# Patient Record
Sex: Male | Born: 1995 | Race: White | Hispanic: No | Marital: Single | Smoking: Never smoker
Health system: Southern US, Community
[De-identification: ages and names within clinical notes are randomized; demographics above are authoritative.]

## PROBLEM LIST (undated history)

## (undated) DIAGNOSIS — Z91018 Allergy to other foods: Secondary | ICD-10-CM

---
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
## 2017-03-24 ENCOUNTER — Other Ambulatory Visit (HOSPITAL_COMMUNITY): Payer: Self-pay | Admitting: Pulmonary Disease

## 2017-03-24 ENCOUNTER — Ambulatory Visit (HOSPITAL_COMMUNITY)
Admission: RE | Admit: 2017-03-24 | Discharge: 2017-03-24 | Disposition: A | Discharge status: Home / Self Care | Payer: 59 | Source: Ambulatory Visit | Attending: Pulmonary Disease | Admitting: Pulmonary Disease

## 2017-03-24 DIAGNOSIS — M25512 Pain in left shoulder: Secondary | ICD-10-CM

## 2017-03-24 DIAGNOSIS — R091 Pleurisy: Secondary | ICD-10-CM | POA: Diagnosis not present

## 2017-03-24 DIAGNOSIS — J45909 Unspecified asthma, uncomplicated: Secondary | ICD-10-CM | POA: Diagnosis not present

## 2017-03-24 DIAGNOSIS — R079 Chest pain, unspecified: Secondary | ICD-10-CM | POA: Diagnosis not present

## 2017-03-24 DIAGNOSIS — L700 Acne vulgaris: Secondary | ICD-10-CM | POA: Diagnosis not present

## 2018-03-23 DIAGNOSIS — J301 Allergic rhinitis due to pollen: Secondary | ICD-10-CM | POA: Diagnosis not present

## 2018-03-23 DIAGNOSIS — J45909 Unspecified asthma, uncomplicated: Secondary | ICD-10-CM | POA: Diagnosis not present

## 2018-06-02 DIAGNOSIS — H5203 Hypermetropia, bilateral: Secondary | ICD-10-CM | POA: Diagnosis not present

## 2018-10-20 IMAGING — DX DG SHOULDER 2+V*L*
3 series · 3 of 3 positions shown · non-contrast
Comparison: None.

CLINICAL DATA: 21-year-old presenting with left shoulder pain and
left-sided chest pain over the past 3 weeks which has worsened over
the past 4 days. No known injuries.

EXAM:
LEFT SHOULDER - 2+ VIEW

[shoulder grashey]
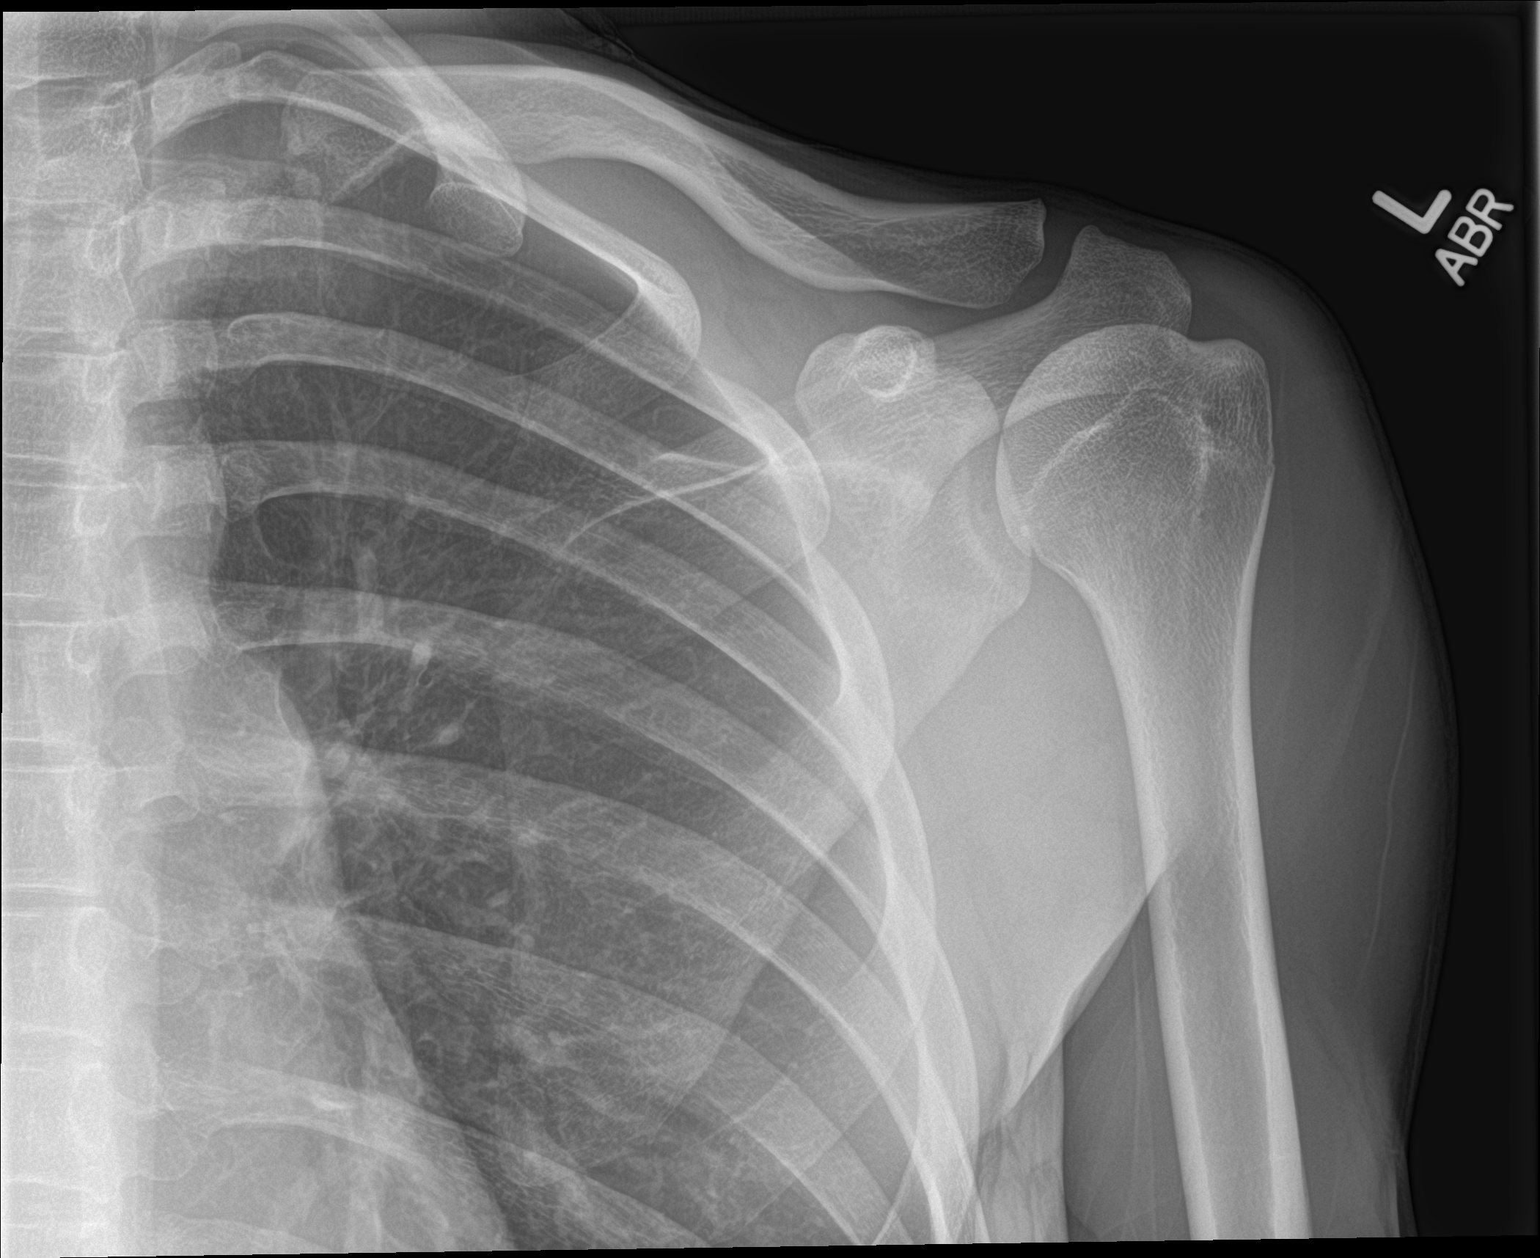

[shoulder y view]
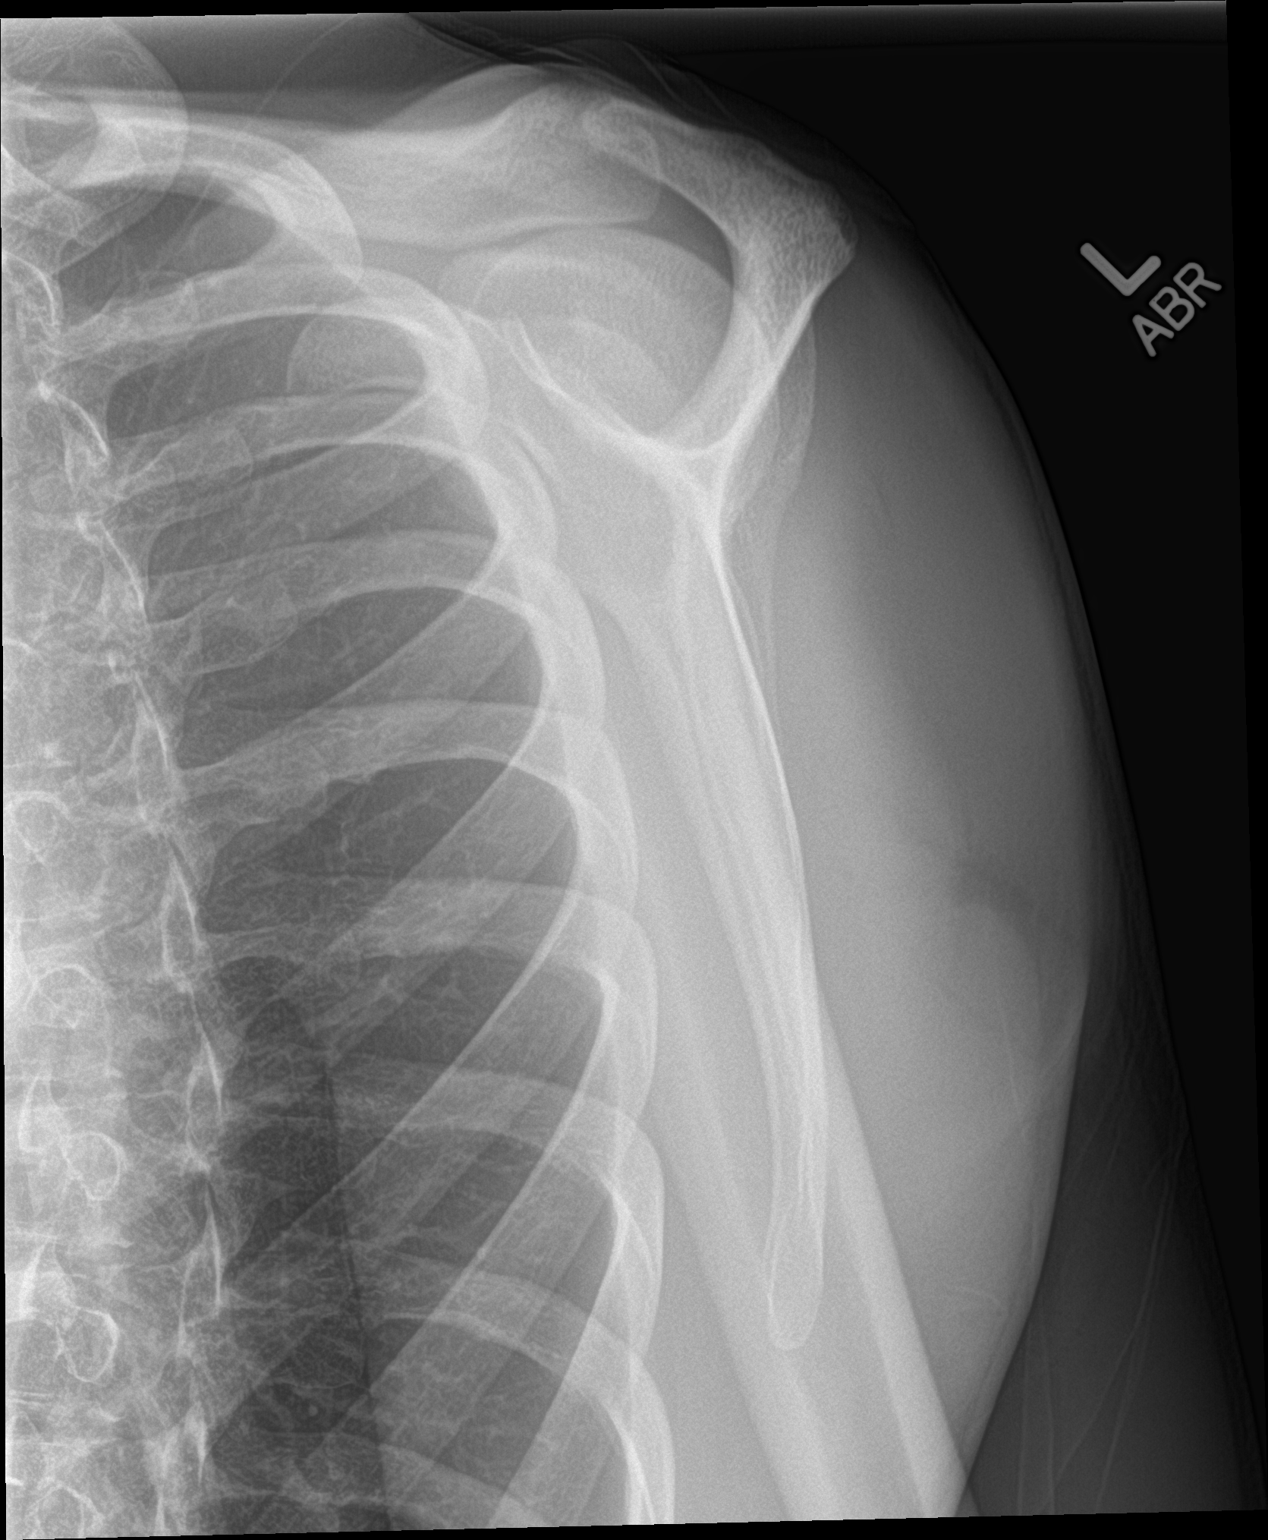

[shoulder axillary]
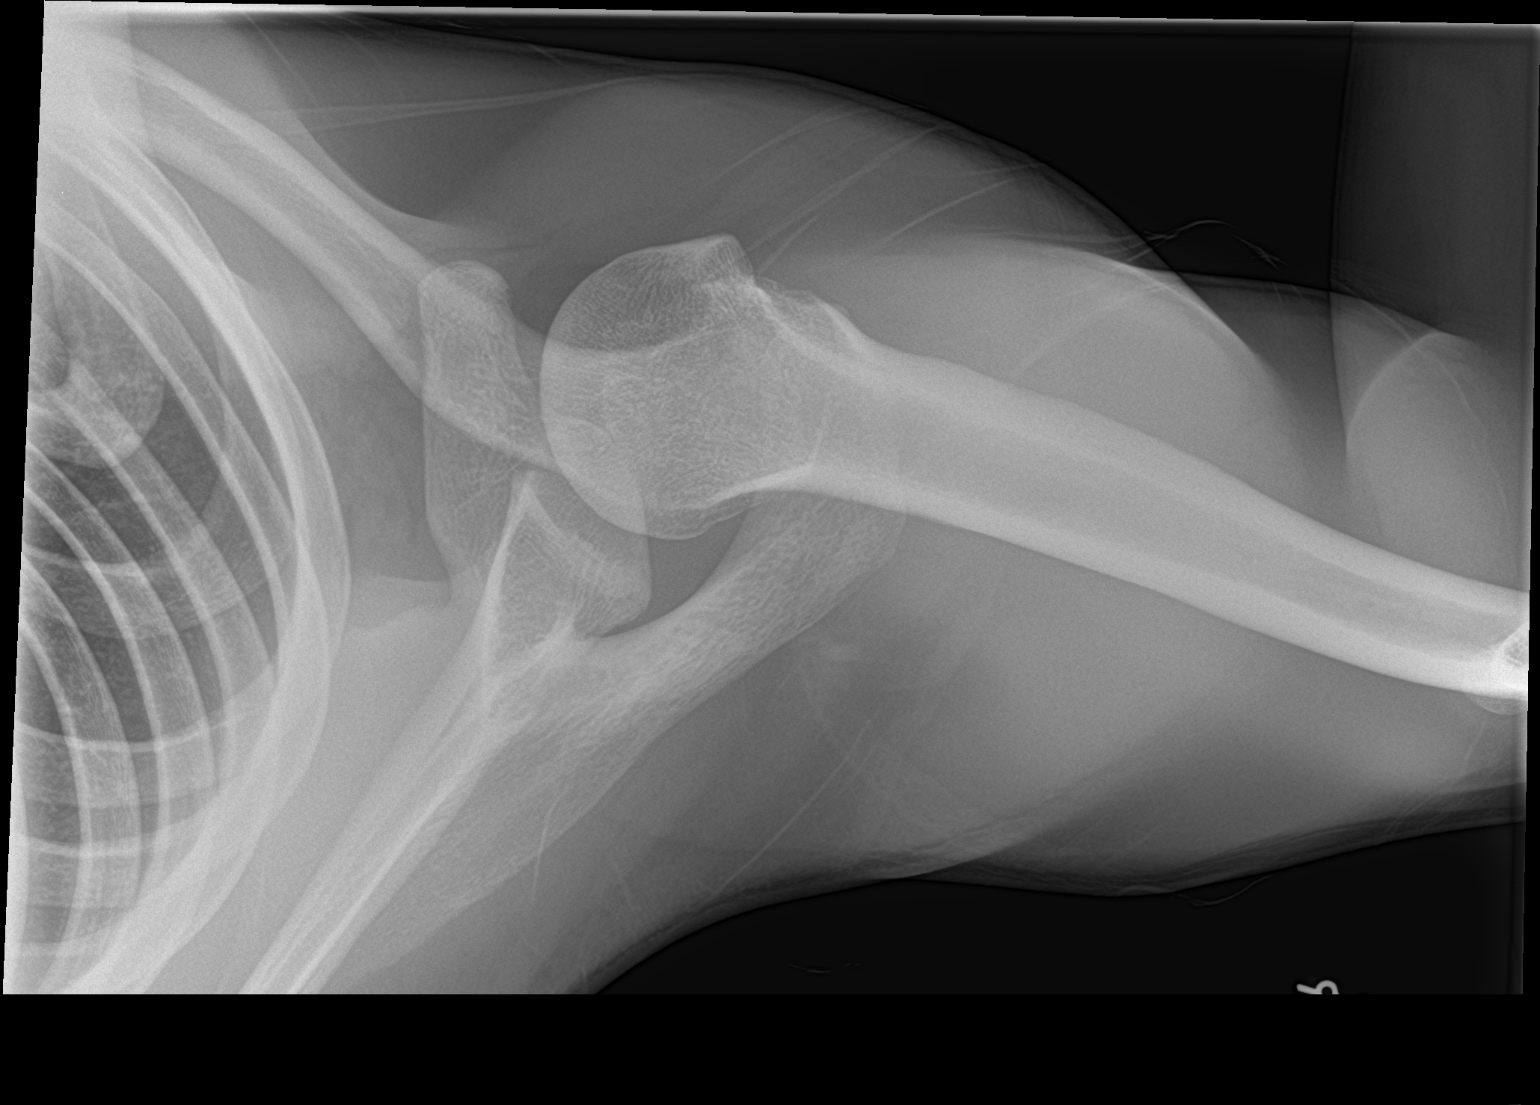

[3 of 3 positions shown; findings below may reference images not displayed]

FINDINGS: No evidence of acute fracture or glenohumeral dislocation.
Glenohumeral joint space well-preserved. Subacromial space
well-preserved. Acromioclavicular joint intact. Well preserved bone
mineral density. No intrinsic osseous abnormality.
IMPRESSION: Normal examination.

## 2018-11-26 ENCOUNTER — Other Ambulatory Visit: Payer: Self-pay

## 2018-11-26 ENCOUNTER — Emergency Department (HOSPITAL_COMMUNITY): Payer: 59

## 2018-11-26 ENCOUNTER — Encounter (HOSPITAL_COMMUNITY): Payer: Self-pay | Admitting: Emergency Medicine

## 2018-11-26 ENCOUNTER — Emergency Department (HOSPITAL_COMMUNITY)
Admission: EM | Admit: 2018-11-26 | Discharge: 2018-11-26 | Disposition: A | Discharge status: Home / Self Care | Payer: 59 | Attending: Emergency Medicine | Admitting: Emergency Medicine

## 2018-11-26 DIAGNOSIS — R112 Nausea with vomiting, unspecified: Secondary | ICD-10-CM | POA: Insufficient documentation

## 2018-11-26 DIAGNOSIS — K529 Noninfective gastroenteritis and colitis, unspecified: Secondary | ICD-10-CM | POA: Insufficient documentation

## 2018-11-26 DIAGNOSIS — E860 Dehydration: Secondary | ICD-10-CM | POA: Insufficient documentation

## 2018-11-26 DIAGNOSIS — R1110 Vomiting, unspecified: Secondary | ICD-10-CM | POA: Diagnosis present

## 2018-11-26 HISTORY — DX: Allergy to other foods: Z91.018

## 2018-11-26 LAB — COMPREHENSIVE METABOLIC PANEL
ALT: 21 U/L (ref 0–44)
ANION GAP: 11 (ref 5–15)
AST: 29 U/L (ref 15–41)
Albumin: 4.6 g/dL (ref 3.5–5.0)
Alkaline Phosphatase: 44 U/L (ref 38–126)
BUN: 25 mg/dL — ABNORMAL HIGH (ref 6–20)
CHLORIDE: 107 mmol/L (ref 98–111)
CO2: 20 mmol/L — ABNORMAL LOW (ref 22–32)
CREATININE: 0.91 mg/dL (ref 0.61–1.24)
Calcium: 9.2 mg/dL (ref 8.9–10.3)
Glucose, Bld: 143 mg/dL — ABNORMAL HIGH (ref 70–99)
POTASSIUM: 3.7 mmol/L (ref 3.5–5.1)
Sodium: 138 mmol/L (ref 135–145)
Total Bilirubin: 3.3 mg/dL — ABNORMAL HIGH (ref 0.3–1.2)
Total Protein: 7.6 g/dL (ref 6.5–8.1)

## 2018-11-26 LAB — CBC WITH DIFFERENTIAL/PLATELET
Abs Immature Granulocytes: 0.05 10*3/uL (ref 0.00–0.07)
BASOS PCT: 0 %
Basophils Absolute: 0.1 10*3/uL (ref 0.0–0.1)
EOS ABS: 0 10*3/uL (ref 0.0–0.5)
Eosinophils Relative: 0 %
HCT: 49.3 % (ref 39.0–52.0)
Hemoglobin: 17.4 g/dL — ABNORMAL HIGH (ref 13.0–17.0)
Immature Granulocytes: 0 %
LYMPHS PCT: 3 %
Lymphs Abs: 0.5 10*3/uL — ABNORMAL LOW (ref 0.7–4.0)
MCH: 29.5 pg (ref 26.0–34.0)
MCHC: 35.3 g/dL (ref 30.0–36.0)
MCV: 83.6 fL (ref 80.0–100.0)
MONO ABS: 1 10*3/uL (ref 0.1–1.0)
MONOS PCT: 7 %
NEUTROS PCT: 90 %
NRBC: 0 % (ref 0.0–0.2)
Neutro Abs: 13.8 10*3/uL — ABNORMAL HIGH (ref 1.7–7.7)
Platelets: 250 10*3/uL (ref 150–400)
RBC: 5.9 MIL/uL — AB (ref 4.22–5.81)
RDW: 12.2 % (ref 11.5–15.5)
WBC: 15.4 10*3/uL — AB (ref 4.0–10.5)

## 2018-11-26 LAB — LIPASE, BLOOD: LIPASE: 27 U/L (ref 11–51)

## 2018-11-26 MED ORDER — SODIUM CHLORIDE 0.9 % IV BOLUS
1000.0000 mL | Freq: Once | INTRAVENOUS | Status: AC
  Administered 2018-11-26: 1000 mL via INTRAVENOUS

## 2018-11-26 MED ORDER — ONDANSETRON HCL 4 MG/2ML IJ SOLN
4.0000 mg | Freq: Once | INTRAMUSCULAR | Status: AC
  Administered 2018-11-26: 4 mg via INTRAVENOUS
  Filled 2018-11-26: qty 2

## 2018-11-26 MED ORDER — LOPERAMIDE HCL 2 MG PO CAPS
4.0000 mg | ORAL_CAPSULE | Freq: Once | ORAL | Status: AC
  Administered 2018-11-26: 4 mg via ORAL
  Filled 2018-11-26: qty 2

## 2018-11-26 MED ORDER — ONDANSETRON HCL 8 MG PO TABS: 8.0000 mg | ORAL_TABLET | ORAL | 0 refills | Status: DC | PRN

## 2018-11-26 NOTE — ED Provider Notes (Signed)
[facility] EMERGENCY DEPARTMENT Provider Note   CSN: [id number] Arrival date & time: 11/26/18  6837    History   Chief Complaint Chief Complaint  Patient presents with  . Emesis    HPI Vincent Hunter is a 23 y.o. male.     Multiple episodes of nausea, vomiting, diarrhea since 0030 this morning.  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  Past medical history includes allergy to alpha-gal.  He is otherwise healthy.  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  Severity of symptoms is moderate.     Past Medical History:  Diagnosis Date  . Allergy to alpha-gal     Patient Active Problem List   Diagnosis Date Noted  . SHOULDER STRAIN, RIGHT 06/02/2009    History reviewed. No pertinent surgical history.      Home Medications    Prior to Admission medications   Medication Sig Start Date End Date Taking? Authorizing Provider  ondansetron (ZOFRAN) 8 MG tablet Take 1 tablet (8 mg total) by mouth every 4 (four) hours as needed. 11/26/18   Donnetta Hutching, MD    Family History No family history on file.  Social History Social History   Tobacco Use  . Smoking status: Never Smoker  . Smokeless tobacco: Never Used  Substance Use Topics  . Alcohol use: Never    Frequency: Never  . Drug use: Never     Allergies   Patient has no known allergies.   Review of Systems Review of Systems  All other systems reviewed and are negative.    Physical Exam Updated Vital Signs BP 112/74 (BP Location: Left Arm)   Pulse 84   Temp 97.8 F (36.6 C) (Oral)   Resp 20   Ht 5\' 11"  (1.803 m)   Wt 63.5 kg   SpO2 100%   BMI 19.53 kg/m   Physical Exam Vitals signs and nursing note reviewed.  Constitutional:      Appearance: He is well-developed.     Comments: Appears dehydrated.  HENT:     Head: Normocephalic and atraumatic.  Eyes:     Conjunctiva/sclera: Conjunctivae normal.  Neck:     Musculoskeletal: Neck supple.   Cardiovascular:     Rate and Rhythm: Normal rate and regular rhythm.  Pulmonary:     Effort: Pulmonary effort is normal.     Breath sounds: Normal breath sounds.  Abdominal:     General: Bowel sounds are normal.     Palpations: Abdomen is soft.     Comments: Minimal diffuse tenderness.  Musculoskeletal: Normal range of motion.  Skin:    General: Skin is warm and dry.  Neurological:     Mental Status: He is alert and oriented to person, place, and time.  Psychiatric:        Behavior: Behavior normal.      ED Treatments / Results  Labs (all labs ordered are listed, but only abnormal results are displayed) Labs Reviewed  CBC WITH DIFFERENTIAL/PLATELET - Abnormal; Notable for the following components:      Result Value   WBC 15.4 (*)    RBC 5.90 (*)    Hemoglobin 17.4 (*)    Neutro Abs 13.8 (*)    Lymphs Abs 0.5 (*)    All other components within normal limits  COMPREHENSIVE METABOLIC PANEL - Abnormal; Notable for the following components:   CO2 20 (*)    Glucose, Bld 143 (*)    BUN 25 (*)  Total Bilirubin 3.3 (*)    All other components within normal limits  LIPASE, BLOOD  URINALYSIS, ROUTINE W REFLEX MICROSCOPIC    EKG None  Radiology No results found.  Procedures Procedures (including critical care time)  Medications Ordered in ED Medications  sodium chloride 0.9 % bolus 1,000 mL (0 mLs Intravenous Stopped 11/26/18 0948)  sodium chloride 0.9 % bolus 1,000 mL (0 mLs Intravenous Stopped 11/26/18 0948)  ondansetron (ZOFRAN) injection 4 mg (4 mg Intravenous Given 11/26/18 0655)  loperamide (IMODIUM) capsule 4 mg (4 mg Oral Given 11/26/18 0720)     Initial Impression / Assessment and Plan / ED Course  I have reviewed the triage vital signs and the nursing notes.  Pertinent labs & imaging results that were available during my care of the patient were reviewed by me and considered in my medical decision making (see chart for details).        History and  physical most consistent with gastroenteritis.  Bilirubin is elevated, but patient confirms this is not a new finding.  He feels much better after 2 L of IV fluids.  Discharge medications Zofran 8 mg  Final Clinical Impressions(s) / ED Diagnoses   Final diagnoses:  Gastroenteritis    ED Discharge Orders         Ordered    ondansetron (ZOFRAN) 8 MG tablet  Every 4 hours PRN     11/26/18 0940           Donnetta Hutching, MD 11/26/18 [phone]

## 2018-11-26 NOTE — ED Notes (Signed)
Warm blanket given

## 2018-11-26 NOTE — ED Triage Notes (Signed)
Pt with N/V/D since 0030 last night. Pt states he has "probably thrown up 25 times".

## 2018-11-26 NOTE — ED Notes (Signed)
Pt states he feels much better at this time.

## 2018-11-26 NOTE — ED Provider Notes (Signed)
MSE was initiated and I personally evaluated the patient and placed orders (if any) at  6:39 AM on November 26, 2018.   Patient states about 1230 this morning he started having vomiting and diarrhea.  He estimates he is had about 25 episodes of each to the point now that he is having dry heaves or has acid fluid coming up.  He states his diarrhea was initially loose and now is watery.  He has some diffuse abdominal discomfort.  Patient is awake and alert, eyes are normal with normal conjunctiva.  Tongue is mildly dry.  Abdomen is soft and nontender to palpation with active bowel sounds.  Patient was started on IV fluids, laboratory testing was done.  He was given Zofran and Imodium.   The patient appears stable so that the remainder of the MSE may be completed by another provider.   Devoria Albe, MD 11/26/18 [phone]

## 2018-11-26 NOTE — Discharge Instructions (Addendum)
Medication for nausea.  Clear liquids.  Rest.

## 2021-07-16 ENCOUNTER — Emergency Department (HOSPITAL_BASED_OUTPATIENT_CLINIC_OR_DEPARTMENT_OTHER)
Admission: EM | Admit: 2021-07-16 | Discharge: 2021-07-16 | Disposition: A | Discharge status: Home / Self Care | Payer: 59 | Attending: Emergency Medicine | Admitting: Emergency Medicine

## 2021-07-16 ENCOUNTER — Encounter (HOSPITAL_BASED_OUTPATIENT_CLINIC_OR_DEPARTMENT_OTHER): Payer: Self-pay | Admitting: *Deleted

## 2021-07-16 ENCOUNTER — Other Ambulatory Visit: Payer: Self-pay

## 2021-07-16 DIAGNOSIS — J101 Influenza due to other identified influenza virus with other respiratory manifestations: Secondary | ICD-10-CM | POA: Diagnosis not present

## 2021-07-16 DIAGNOSIS — Z20822 Contact with and (suspected) exposure to covid-19: Secondary | ICD-10-CM | POA: Insufficient documentation

## 2021-07-16 DIAGNOSIS — R000 Tachycardia, unspecified: Secondary | ICD-10-CM | POA: Insufficient documentation

## 2021-07-16 DIAGNOSIS — Z28310 Unvaccinated for covid-19: Secondary | ICD-10-CM | POA: Diagnosis not present

## 2021-07-16 DIAGNOSIS — D72829 Elevated white blood cell count, unspecified: Secondary | ICD-10-CM | POA: Insufficient documentation

## 2021-07-16 DIAGNOSIS — R059 Cough, unspecified: Secondary | ICD-10-CM | POA: Diagnosis present

## 2021-07-16 DIAGNOSIS — M791 Myalgia, unspecified site: Secondary | ICD-10-CM

## 2021-07-16 LAB — CK: Total CK: 41 U/L — ABNORMAL LOW (ref 49–397)

## 2021-07-16 LAB — URINALYSIS, ROUTINE W REFLEX MICROSCOPIC
Bilirubin Urine: NEGATIVE
Glucose, UA: NEGATIVE mg/dL
Leukocytes,Ua: NEGATIVE
Nitrite: NEGATIVE
Protein, ur: 30 mg/dL — AB
Specific Gravity, Urine: 1.023 (ref 1.005–1.030)
pH: 6 (ref 5.0–8.0)

## 2021-07-16 LAB — COMPREHENSIVE METABOLIC PANEL
ALT: 13 U/L (ref 0–44)
AST: 17 U/L (ref 15–41)
Albumin: 4.2 g/dL (ref 3.5–5.0)
Alkaline Phosphatase: 32 U/L — ABNORMAL LOW (ref 38–126)
Anion gap: 11 (ref 5–15)
BUN: 11 mg/dL (ref 6–20)
CO2: 24 mmol/L (ref 22–32)
Calcium: 8.5 mg/dL — ABNORMAL LOW (ref 8.9–10.3)
Chloride: 98 mmol/L (ref 98–111)
Creatinine, Ser: 1.08 mg/dL (ref 0.61–1.24)
GFR, Estimated: >60 mL/min (ref >60)
Glucose, Bld: 114 mg/dL — ABNORMAL HIGH (ref 70–99)
Potassium: 3.3 mmol/L — ABNORMAL LOW (ref 3.5–5.1)
Sodium: 133 mmol/L — ABNORMAL LOW (ref 135–145)
Total Bilirubin: 3.7 mg/dL — ABNORMAL HIGH (ref 0.3–1.2)
Total Protein: 7.3 g/dL (ref 6.5–8.1)

## 2021-07-16 LAB — CBC WITH DIFFERENTIAL/PLATELET
Abs Immature Granulocytes: 0.04 10*3/uL (ref 0.00–0.07)
Basophils Absolute: 0 10*3/uL (ref 0.0–0.1)
Basophils Relative: 0 %
Eosinophils Absolute: 0 10*3/uL (ref 0.0–0.5)
Eosinophils Relative: 0 %
HCT: 43.4 % (ref 39.0–52.0)
Hemoglobin: 15.2 g/dL (ref 13.0–17.0)
Immature Granulocytes: 0 %
Lymphocytes Relative: 7 %
Lymphs Abs: 0.8 10*3/uL (ref 0.7–4.0)
MCH: 29.3 pg (ref 26.0–34.0)
MCHC: 35 g/dL (ref 30.0–36.0)
MCV: 83.6 fL (ref 80.0–100.0)
Monocytes Absolute: 1 10*3/uL (ref 0.1–1.0)
Monocytes Relative: 9 %
Neutro Abs: 9.4 10*3/uL — ABNORMAL HIGH (ref 1.7–7.7)
Neutrophils Relative %: 84 %
Platelets: 193 10*3/uL (ref 150–400)
RBC: 5.19 MIL/uL (ref 4.22–5.81)
RDW: 12.1 % (ref 11.5–15.5)
WBC: 11.3 10*3/uL — ABNORMAL HIGH (ref 4.0–10.5)
nRBC: 0 % (ref 0.0–0.2)

## 2021-07-16 LAB — RESP PANEL BY RT-PCR (FLU A&B, COVID) ARPGX2
Influenza A by PCR: POSITIVE — AB
Influenza B by PCR: NEGATIVE
SARS Coronavirus 2 by RT PCR: NEGATIVE

## 2021-07-16 LAB — LIPASE, BLOOD: Lipase: <10 U/L — ABNORMAL LOW (ref 11–51)

## 2021-07-16 LAB — LACTIC ACID, PLASMA: Lactic Acid, Venous: 1.2 mmol/L (ref 0.5–1.9)

## 2021-07-16 LAB — GROUP A STREP BY PCR: Group A Strep by PCR: NOT DETECTED

## 2021-07-16 MED ORDER — IBUPROFEN 400 MG PO TABS
400.0000 mg | ORAL_TABLET | Freq: Once | ORAL | Status: AC | PRN
  Administered 2021-07-16: 400 mg via ORAL
  Filled 2021-07-16: qty 1

## 2021-07-16 MED ORDER — ACETAMINOPHEN 325 MG PO TABS
650.0000 mg | ORAL_TABLET | Freq: Once | ORAL | Status: AC
  Administered 2021-07-16: 650 mg via ORAL
  Filled 2021-07-16: qty 2

## 2021-07-16 MED ORDER — OSELTAMIVIR PHOSPHATE 75 MG PO CAPS: 75.0000 mg | ORAL_CAPSULE | Freq: Two times a day (BID) | ORAL | 0 refills | Status: DC

## 2021-07-16 MED ORDER — SODIUM CHLORIDE 0.9 % IV BOLUS
1000.0000 mL | Freq: Once | INTRAVENOUS | Status: AC
  Administered 2021-07-16: 1000 mL via INTRAVENOUS

## 2021-07-16 NOTE — ED Triage Notes (Signed)
Pt began having flu like symptoms Tuesday with body aches, URI and congestion, sore throat and vomiting x1.

## 2021-07-16 NOTE — ED Provider Notes (Signed)
[facility] EMERGENCY DEPT Provider Note   CSN: [id number] Arrival date & time: 07/16/21  1641     History Chief Complaint  Patient presents with   Illness    Vincent Hunter is a 25 y.o. male.  He is here with 3 days of symptoms.  Body aches pain in his kidneys, nausea, vomiting x1, nasal congestion mild sore throat and fever.  He has had decreased appetite.  He has been laying in bed for 2 days.  Tolerating p.o. but no appetite.  Using Alka-Seltzer and TheraFlu.  He is not COVID vaccinated or flu vaccinated.  The history is provided by the patient.  Influenza Presenting symptoms: cough, fatigue, fever, myalgias, nausea, sore throat and vomiting   Presenting symptoms: no diarrhea and no shortness of breath   Myalgias:    Location:  Back   Quality:  Aching   Severity:  Severe   Onset quality:  Gradual   Duration:  3 days   Timing:  Constant   Progression:  Unchanged Associated symptoms: decreased appetite   Associated symptoms: no chills, no neck stiffness and no syncope   Risk factors: no immunocompromised state and no sick contacts       Past Medical History:  Diagnosis Date   Allergy to alpha-gal     Patient Active Problem List   Diagnosis Date Noted   SHOULDER STRAIN, RIGHT 06/02/2009    History reviewed. No pertinent surgical history.     No family history on file.  Social History   Tobacco Use   Smoking status: Never   Smokeless tobacco: Never  Substance Use Topics   Alcohol use: Never   Drug use: Never    Home Medications Prior to Admission medications   Medication Sig Start Date End Date Taking? Authorizing Provider  ondansetron (ZOFRAN) 8 MG tablet Take 1 tablet (8 mg total) by mouth every 4 (four) hours as needed. 11/26/18   Donnetta Hutching, MD    Allergies    Patient has no known allergies.  Review of Systems   Review of Systems  Constitutional:  Positive for decreased appetite, fatigue and fever. Negative for chills.   HENT:  Positive for sore throat.   Eyes:  Negative for visual disturbance.  Respiratory:  Positive for cough. Negative for shortness of breath.   Cardiovascular:  Negative for chest pain.  Gastrointestinal:  Positive for nausea and vomiting. Negative for abdominal pain and diarrhea.  Genitourinary:  Negative for dysuria and hematuria.  Musculoskeletal:  Positive for myalgias. Negative for neck stiffness.  Skin:  Negative for rash.  Neurological:  Negative for syncope.   Physical Exam Updated Vital Signs BP (!) 145/78 (BP Location: Right Arm)   Pulse (!) 115   Temp (!) 103 F (39.4 C) (Oral)   Resp 16   SpO2 97%   Physical Exam Vitals and nursing note reviewed.  Constitutional:      Appearance: Normal appearance. He is well-developed.  HENT:     Head: Normocephalic and atraumatic.  Eyes:     Conjunctiva/sclera: Conjunctivae normal.  Cardiovascular:     Rate and Rhythm: Regular rhythm. Tachycardia present.     Heart sounds: No murmur heard. Pulmonary:     Effort: Pulmonary effort is normal. No respiratory distress.     Breath sounds: Normal breath sounds.  Abdominal:     Palpations: Abdomen is soft.     Tenderness: There is no abdominal tenderness. There is no guarding or rebound.  Musculoskeletal:  General: No deformity or signs of injury. Normal range of motion.     Cervical back: Neck supple.  Skin:    General: Skin is warm and dry.  Neurological:     General: No focal deficit present.     Mental Status: He is alert.     GCS: GCS eye subscore is 4. GCS verbal subscore is 5. GCS motor subscore is 6.     Sensory: No sensory deficit.     Motor: No weakness.     Gait: Gait normal.    ED Results / Procedures / Treatments   Labs (all labs ordered are listed, but only abnormal results are displayed) Labs Reviewed  RESP PANEL BY RT-PCR (FLU A&B, COVID) ARPGX2 - Abnormal; Notable for the following components:      Result Value   Influenza A by PCR POSITIVE (*)     All other components within normal limits  URINALYSIS, ROUTINE W REFLEX MICROSCOPIC - Abnormal; Notable for the following components:   Hgb urine dipstick SMALL (*)    Ketones, ur TRACE (*)    Protein, ur 30 (*)    All other components within normal limits  CBC WITH DIFFERENTIAL/PLATELET - Abnormal; Notable for the following components:   WBC 11.3 (*)    Neutro Abs 9.4 (*)    All other components within normal limits  COMPREHENSIVE METABOLIC PANEL - Abnormal; Notable for the following components:   Sodium 133 (*)    Potassium 3.3 (*)    Glucose, Bld 114 (*)    Calcium 8.5 (*)    Alkaline Phosphatase 32 (*)    Total Bilirubin 3.7 (*)    All other components within normal limits  CK - Abnormal; Notable for the following components:   Total CK 41 (*)    All other components within normal limits  LIPASE, BLOOD - Abnormal; Notable for the following components:   Lipase <10 (*)    All other components within normal limits  GROUP A STREP BY PCR  LACTIC ACID, PLASMA    EKG None  Radiology No results found.  Procedures Procedures   Medications Ordered in ED Medications  ibuprofen (ADVIL) tablet 400 mg (has no administration in time range)  acetaminophen (TYLENOL) tablet 650 mg (has no administration in time range)  sodium chloride 0.9 % bolus 1,000 mL (has no administration in time range)    ED Course  I have reviewed the triage vital signs and the nursing notes.  Pertinent labs & imaging results that were available during my care of the patient were reviewed by me and considered in my medical decision making (see chart for details).  Clinical Course as of 07/17/21 1036  Thu Jul 16, 2021  1817 Bili has been elevated on prior labs done 3 years ago. [MB]  [id number] Reviewed results with work-up with patient.  His pain is somewhat improved and tachycardia improving.  Recommended continued Tylenol and ibuprofen, fluids, rest and will prescribe Tamiflu.  Return instructions  discussed [MB]    Clinical Course User Index [MB] Hayden Rasmussen, MD   MDM Rules/Calculators/A&P                          Vincent Hunter was evaluated in Emergency Department on 07/16/2021 for the symptoms described in the history of present illness. He was evaluated in the context of the global COVID-19 pandemic, which necessitated consideration that the patient might be at risk for infection with  the SARS-CoV-2 virus that causes COVID-19. Institutional protocols and algorithms that pertain to the evaluation of patients at risk for COVID-19 are in a state of rapid change based on information released by regulatory bodies including the CDC and federal and state organizations. These policies and algorithms were followed during the patient's care in the ED.  This patient complains of body aches, kidney pain, nasal congestion sore throat; this involves an extensive number of treatment Options and is a complaint that carries with it a high risk of complications and Morbidity. The differential includes flu, COVID, dehydration, metabolic derangement, UTI, Pilo, stone  I ordered, reviewed and interpreted labs, which included CBC mildly elevated white count normal hemoglobin, chemistries fairly unremarkable other than elevated bilirubin seen prior also, CK normal urinalysis without signs of infection I ordered medication IV fluids and oral antipyretics Additional history obtained from significant other Previous records obtained and reviewed in epic no recent admissions  After the interventions stated above, I reevaluated the patient and found patient to be somewhat improved after fluids and defervescence.  Reviewed work-up with him.  He is comfortable with symptomatic treatment at home and Tamiflu return instructions discussed.   Final Clinical Impression(s) / ED Diagnoses Final diagnoses:  Influenza A  Myalgia due to viral infection    Rx / DC Orders ED Discharge Orders          Ordered     oseltamivir (TAMIFLU) 75 MG capsule  Every 12 hours        07/16/21 1827             Hayden Rasmussen, MD 07/17/21 1039

## 2021-07-16 NOTE — ED Notes (Signed)
Dc instructions reviewed with patient. Patient voiced understanding. Dc with belongings.  °

## 2021-07-16 NOTE — Discharge Instructions (Signed)
He was seen in the emergency department for flulike symptoms and pain in your flanks.  Your lab work was fairly unremarkable and your urine did not show any signs of infection.  You tested positive for influenza A.  We are prescribing you Tamiflu.  Please continue to stay well-hydrated use Tylenol and ibuprofen.  Rest.  Follow-up with your doctor.  Return to the emergency department if any worsening or concerning symptoms

## 2023-06-27 ENCOUNTER — Telehealth: Payer: 59 | Admitting: Physician Assistant

## 2023-06-27 DIAGNOSIS — J069 Acute upper respiratory infection, unspecified: Secondary | ICD-10-CM | POA: Diagnosis not present

## 2023-06-27 MED ORDER — PSEUDOEPH-BROMPHEN-DM 30-2-10 MG/5ML PO SYRP: 5.0000 mL | ORAL_SOLUTION | Freq: Four times a day (QID) | ORAL | 0 refills | Status: AC | PRN

## 2023-06-27 MED ORDER — FLUTICASONE PROPIONATE 50 MCG/ACT NA SUSP: 2.0000 | Freq: Every day | NASAL | 0 refills | Status: AC

## 2023-06-27 NOTE — Progress Notes (Signed)

## 2023-07-13 ENCOUNTER — Encounter (HOSPITAL_BASED_OUTPATIENT_CLINIC_OR_DEPARTMENT_OTHER): Payer: Self-pay | Admitting: *Deleted
